# Patient Record
Sex: Male | Born: 1970 | Race: White | Hispanic: Yes | Marital: Married | Smoking: Never smoker
Health system: Southern US, Community
[De-identification: ages and names within clinical notes are randomized; demographics above are authoritative.]

## PROBLEM LIST (undated history)

## (undated) ENCOUNTER — Emergency Department (HOSPITAL_COMMUNITY): Payer: Self-pay | Source: Home / Self Care

## (undated) DIAGNOSIS — J189 Pneumonia, unspecified organism: Secondary | ICD-10-CM

## (undated) HISTORY — PX: NO PAST SURGERIES: SHX2092

---

## 2017-11-20 ENCOUNTER — Other Ambulatory Visit: Payer: Self-pay

## 2017-11-20 ENCOUNTER — Emergency Department (HOSPITAL_COMMUNITY): Payer: Self-pay

## 2017-11-20 ENCOUNTER — Inpatient Hospital Stay (HOSPITAL_COMMUNITY)
Admission: EM | Admit: 2017-11-20 | Discharge: 2017-11-22 | DRG: 965 | Disposition: A | Discharge status: Home / Self Care | Payer: Self-pay | Attending: Surgery | Admitting: Surgery

## 2017-11-20 ENCOUNTER — Encounter (HOSPITAL_COMMUNITY): Payer: Self-pay | Admitting: *Deleted

## 2017-11-20 DIAGNOSIS — T1490XA Injury, unspecified, initial encounter: Secondary | ICD-10-CM

## 2017-11-20 DIAGNOSIS — F10129 Alcohol abuse with intoxication, unspecified: Secondary | ICD-10-CM | POA: Diagnosis present

## 2017-11-20 DIAGNOSIS — S2500XA Unspecified injury of thoracic aorta, initial encounter: Secondary | ICD-10-CM

## 2017-11-20 DIAGNOSIS — S065X9A Traumatic subdural hemorrhage with loss of consciousness of unspecified duration, initial encounter: Secondary | ICD-10-CM

## 2017-11-20 DIAGNOSIS — S066X9A Traumatic subarachnoid hemorrhage with loss of consciousness of unspecified duration, initial encounter: Principal | ICD-10-CM | POA: Diagnosis present

## 2017-11-20 DIAGNOSIS — R079 Chest pain, unspecified: Secondary | ICD-10-CM

## 2017-11-20 DIAGNOSIS — Y908 Blood alcohol level of 240 mg/100 ml or more: Secondary | ICD-10-CM | POA: Diagnosis present

## 2017-11-20 DIAGNOSIS — S065XAA Traumatic subdural hemorrhage with loss of consciousness status unknown, initial encounter: Secondary | ICD-10-CM

## 2017-11-20 DIAGNOSIS — J984 Other disorders of lung: Secondary | ICD-10-CM | POA: Diagnosis present

## 2017-11-20 DIAGNOSIS — S27892A Contusion of other specified intrathoracic organs, initial encounter: Secondary | ICD-10-CM | POA: Diagnosis present

## 2017-11-20 DIAGNOSIS — I959 Hypotension, unspecified: Secondary | ICD-10-CM | POA: Diagnosis present

## 2017-11-20 HISTORY — DX: Pneumonia, unspecified organism: J18.9

## 2017-11-20 LAB — COMPREHENSIVE METABOLIC PANEL
ALK PHOS: 70 U/L (ref 38–126)
ALT: 94 U/L — AB (ref 17–63)
AST: 111 U/L — AB (ref 15–41)
Albumin: 3.6 g/dL (ref 3.5–5.0)
Anion gap: 9 (ref 5–15)
BILIRUBIN TOTAL: 0.7 mg/dL (ref 0.3–1.2)
BUN: 15 mg/dL (ref 6–20)
CO2: 21 mmol/L — ABNORMAL LOW (ref 22–32)
CREATININE: 0.76 mg/dL (ref 0.61–1.24)
Calcium: 8.5 mg/dL — ABNORMAL LOW (ref 8.9–10.3)
Chloride: 111 mmol/L (ref 101–111)
GFR calc Af Amer: >60 mL/min (ref >60)
Glucose, Bld: 123 mg/dL — ABNORMAL HIGH (ref 65–99)
Potassium: 3.6 mmol/L (ref 3.5–5.1)
Sodium: 141 mmol/L (ref 135–145)
TOTAL PROTEIN: 6.3 g/dL — AB (ref 6.5–8.1)

## 2017-11-20 LAB — TYPE AND SCREEN
ABO/RH(D): O POS
Antibody Screen: NEGATIVE

## 2017-11-20 LAB — URINALYSIS, ROUTINE W REFLEX MICROSCOPIC
BILIRUBIN URINE: NEGATIVE
Bacteria, UA: NONE SEEN
GLUCOSE, UA: 50 mg/dL — AB
Ketones, ur: NEGATIVE mg/dL
LEUKOCYTES UA: NEGATIVE
NITRITE: NEGATIVE
Protein, ur: NEGATIVE mg/dL
SPECIFIC GRAVITY, URINE: 1.026 (ref 1.005–1.030)
Squamous Epithelial / LPF: NONE SEEN
pH: 6 (ref 5.0–8.0)

## 2017-11-20 LAB — MRSA PCR SCREENING: MRSA by PCR: NEGATIVE

## 2017-11-20 LAB — I-STAT CHEM 8, ED
BUN: 18 mg/dL (ref 6–20)
CALCIUM ION: 1.08 mmol/L — AB (ref 1.15–1.40)
CHLORIDE: 109 mmol/L (ref 101–111)
Creatinine, Ser: 1 mg/dL (ref 0.61–1.24)
Glucose, Bld: 127 mg/dL — ABNORMAL HIGH (ref 65–99)
HCT: 46 % (ref 39.0–52.0)
Hemoglobin: 15.6 g/dL (ref 13.0–17.0)
Potassium: 3.6 mmol/L (ref 3.5–5.1)
SODIUM: 143 mmol/L (ref 135–145)
TCO2: 21 mmol/L — ABNORMAL LOW (ref 22–32)

## 2017-11-20 LAB — CBC WITH DIFFERENTIAL/PLATELET
Basophils Absolute: 0 10*3/uL (ref 0.0–0.1)
Basophils Relative: 0 %
EOS ABS: 0.3 10*3/uL (ref 0.0–0.7)
Eosinophils Relative: 4 %
HEMATOCRIT: 45.4 % (ref 39.0–52.0)
HEMOGLOBIN: 15.4 g/dL (ref 13.0–17.0)
LYMPHS ABS: 2.2 10*3/uL (ref 0.7–4.0)
Lymphocytes Relative: 28 %
MCH: 31.2 pg (ref 26.0–34.0)
MCHC: 33.9 g/dL (ref 30.0–36.0)
MCV: 92.1 fL (ref 78.0–100.0)
MONOS PCT: 6 %
Monocytes Absolute: 0.4 10*3/uL (ref 0.1–1.0)
NEUTROS PCT: 62 %
Neutro Abs: 4.8 10*3/uL (ref 1.7–7.7)
Platelets: 317 10*3/uL (ref 150–400)
RBC: 4.93 MIL/uL (ref 4.22–5.81)
RDW: 13.3 % (ref 11.5–15.5)
WBC: 7.7 10*3/uL (ref 4.0–10.5)

## 2017-11-20 LAB — CBC
HEMATOCRIT: 43.7 % (ref 39.0–52.0)
Hemoglobin: 15 g/dL (ref 13.0–17.0)
MCH: 31.4 pg (ref 26.0–34.0)
MCHC: 34.3 g/dL (ref 30.0–36.0)
MCV: 91.6 fL (ref 78.0–100.0)
PLATELETS: 300 10*3/uL (ref 150–400)
RBC: 4.77 MIL/uL (ref 4.22–5.81)
RDW: 13.2 % (ref 11.5–15.5)
WBC: 10.9 10*3/uL — AB (ref 4.0–10.5)

## 2017-11-20 LAB — PROTIME-INR
INR: 0.96
Prothrombin Time: 12.6 seconds (ref 11.4–15.2)

## 2017-11-20 LAB — I-STAT CG4 LACTIC ACID, ED
Lactic Acid, Venous: 2.26 mmol/L (ref 0.5–1.9)
Lactic Acid, Venous: 1.69 mmol/L (ref 0.5–1.9)

## 2017-11-20 LAB — ABO/RH: ABO/RH(D): O POS

## 2017-11-20 LAB — ETHANOL: ALCOHOL ETHYL (B): 320 mg/dL — AB (ref <10)

## 2017-11-20 MED ORDER — TRAMADOL HCL 50 MG PO TABS
50.0000 mg | ORAL_TABLET | Freq: Four times a day (QID) | ORAL | Status: DC | PRN
  Administered 2017-11-21 (×2): 50 mg via ORAL
  Filled 2017-11-20 (×2): qty 1

## 2017-11-20 MED ORDER — FOLIC ACID 1 MG PO TABS
1.0000 mg | ORAL_TABLET | Freq: Every day | ORAL | Status: DC
  Administered 2017-11-20 – 2017-11-22 (×3): 1 mg via ORAL
  Filled 2017-11-20 (×3): qty 1

## 2017-11-20 MED ORDER — LORAZEPAM 1 MG PO TABS: 1.0000 mg | ORAL_TABLET | Freq: Four times a day (QID) | ORAL | Status: DC | PRN

## 2017-11-20 MED ORDER — VITAMIN B-1 100 MG PO TABS
100.0000 mg | ORAL_TABLET | Freq: Every day | ORAL | Status: DC
  Administered 2017-11-20: 100 mg via ORAL
  Filled 2017-11-20 (×3): qty 1

## 2017-11-20 MED ORDER — ONDANSETRON HCL 4 MG/2ML IJ SOLN: 4.0000 mg | Freq: Four times a day (QID) | INTRAMUSCULAR | Status: DC | PRN

## 2017-11-20 MED ORDER — INFLUENZA VAC SPLIT QUAD 0.5 ML IM SUSY
0.5000 mL | PREFILLED_SYRINGE | INTRAMUSCULAR | Status: DC
  Filled 2017-11-20: qty 0.5

## 2017-11-20 MED ORDER — ADULT MULTIVITAMIN W/MINERALS CH
1.0000 | ORAL_TABLET | Freq: Every day | ORAL | Status: DC
  Administered 2017-11-20 – 2017-11-22 (×3): 1 via ORAL
  Filled 2017-11-20 (×3): qty 1

## 2017-11-20 MED ORDER — LORAZEPAM 1 MG PO TABS: 0.0000 mg | ORAL_TABLET | Freq: Two times a day (BID) | ORAL | Status: DC

## 2017-11-20 MED ORDER — HYDROMORPHONE HCL 1 MG/ML IJ SOLN
0.5000 mg | INTRAMUSCULAR | Status: DC | PRN
  Administered 2017-11-20 (×3): 0.5 mg via INTRAVENOUS
  Filled 2017-11-20: qty 1
  Filled 2017-11-20: qty 0.5
  Filled 2017-11-20: qty 1

## 2017-11-20 MED ORDER — IOPAMIDOL (ISOVUE-300) INJECTION 61%
INTRAVENOUS | Status: AC
  Administered 2017-11-20: 100 mL
  Filled 2017-11-20: qty 100

## 2017-11-20 MED ORDER — LORAZEPAM 2 MG/ML IJ SOLN: 1.0000 mg | Freq: Four times a day (QID) | INTRAMUSCULAR | Status: DC | PRN

## 2017-11-20 MED ORDER — IOPAMIDOL (ISOVUE-370) INJECTION 76%
INTRAVENOUS | Status: AC
  Administered 2017-11-20: 100 mL via INTRAVENOUS
  Filled 2017-11-20: qty 100

## 2017-11-20 MED ORDER — LACTATED RINGERS IV SOLN
INTRAVENOUS | Status: DC
  Administered 2017-11-20: 23:00:00 via INTRAVENOUS
  Administered 2017-11-20: 1000 mL via INTRAVENOUS

## 2017-11-20 MED ORDER — LORAZEPAM 1 MG PO TABS: 0.0000 mg | ORAL_TABLET | Freq: Four times a day (QID) | ORAL | Status: AC

## 2017-11-20 MED ORDER — DOCUSATE SODIUM 100 MG PO CAPS
200.0000 mg | ORAL_CAPSULE | Freq: Two times a day (BID) | ORAL | Status: DC
  Administered 2017-11-20 – 2017-11-22 (×5): 200 mg via ORAL
  Filled 2017-11-20 (×6): qty 2

## 2017-11-20 MED ORDER — ONDANSETRON 4 MG PO TBDP: 4.0000 mg | ORAL_TABLET | Freq: Four times a day (QID) | ORAL | Status: DC | PRN

## 2017-11-20 MED ORDER — THIAMINE HCL 100 MG/ML IJ SOLN
100.0000 mg | Freq: Every day | INTRAMUSCULAR | Status: DC
  Administered 2017-11-21 – 2017-11-22 (×2): 100 mg via INTRAVENOUS
  Filled 2017-11-20 (×2): qty 2

## 2017-11-20 MED ORDER — METOPROLOL TARTRATE 5 MG/5ML IV SOLN: 5.0000 mg | Freq: Four times a day (QID) | INTRAVENOUS | Status: DC | PRN

## 2017-11-20 NOTE — ED Notes (Signed)
Aspen collar placed on the t

## 2017-11-20 NOTE — ED Notes (Signed)
Family at bedside. 
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
## 2017-11-20 NOTE — Progress Notes (Signed)
VASCULAR SURGERY  Addendum: I have reviewed his CT angios of the abdomen and chest. There is no aortic injury. Vascular surgery will be available as needed.  Waverly Ferrarihristopher Dickson, MD, FACS Beeper [phone] Office: [phone]

## 2017-11-20 NOTE — ED Notes (Signed)
The npt returned from c-t

## 2017-11-20 NOTE — Progress Notes (Signed)
Patient ID: Nathaniel [name], male   DOB: 06-21-1971, 46 y.o.   MRN: [id number] No aortic injury on cta, vascular signed off Small sdh/sah- will check ct if clinically indicated Will do f/e c spine per nsurg tomorrow once more clear Remain overnight for observation
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
## 2017-11-20 NOTE — ED Notes (Signed)
Pt voided yellow urine

## 2017-11-20 NOTE — ED Notes (Signed)
Clear Liquid diet lunch tray ordered @ 1100.
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
## 2017-11-20 NOTE — Consult Note (Signed)
  In my previous dictation I forgot to mention that he will need flexion-extension C-spine x-rays prior to having the hard collar discontinued.
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
## 2017-11-20 NOTE — ED Notes (Signed)
To ct

## 2017-11-20 NOTE — ED Notes (Signed)
Port chest xray 
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
## 2017-11-20 NOTE — ED Notes (Signed)
Pt back to xray for a c-t angio of chest and abdomen
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
## 2017-11-21 ENCOUNTER — Inpatient Hospital Stay (HOSPITAL_COMMUNITY): Payer: Self-pay

## 2017-11-21 LAB — HIV ANTIBODY (ROUTINE TESTING W REFLEX): HIV SCREEN 4TH GENERATION: NONREACTIVE

## 2017-11-21 MED ORDER — ACETAMINOPHEN 325 MG PO TABS
650.0000 mg | ORAL_TABLET | Freq: Four times a day (QID) | ORAL | Status: DC
  Administered 2017-11-21 – 2017-11-22 (×5): 650 mg via ORAL
  Filled 2017-11-21 (×5): qty 2
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
## 2017-11-21 NOTE — Progress Notes (Signed)
Patient ID: Nathaniel ShutterYovani Danker, male   DOB: [birth year], 46 y.o.   MRN: [id number] Patient doing well complains of a little pain in the back that said but manageable  Awake alert moves all extremities well and has full range of motion with minimal pain  Patient going down for cervical flexion extension films if those are okay collar can be DC'd.
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
## 2017-11-22 NOTE — Progress Notes (Signed)
Went over all discharge instructions with pt - English verbal - written Spanish for follow up appointments and restrictions.

## 2017-11-22 NOTE — Progress Notes (Signed)
Pt discharged per W/C by Nurse tech  - family to meet him downstairs - retrieved patients belongings from safe & given to pr prior to d/c. Alert/oriented Neuro checks stable CIWA score 0
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
## 2017-11-22 NOTE — Discharge Instructions (Signed)
-  see above 
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
